# Patient Record
Sex: Male | Born: 1995 | Hispanic: No | Marital: Single | Smoking: Never smoker
Health system: Southern US, Community
[De-identification: ages and names within clinical notes are randomized; demographics above are authoritative.]

## PROBLEM LIST (undated history)

## (undated) DIAGNOSIS — IMO0001 Reserved for inherently not codable concepts without codable children: Secondary | ICD-10-CM

## (undated) DIAGNOSIS — K219 Gastro-esophageal reflux disease without esophagitis: Secondary | ICD-10-CM

## (undated) DIAGNOSIS — F988 Other specified behavioral and emotional disorders with onset usually occurring in childhood and adolescence: Secondary | ICD-10-CM

## (undated) DIAGNOSIS — Z9109 Other allergy status, other than to drugs and biological substances: Secondary | ICD-10-CM

## (undated) HISTORY — PX: WISDOM TOOTH EXTRACTION: SHX21

## (undated) HISTORY — PX: KNEE SURGERY: SHX244

---
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
## 2015-10-15 ENCOUNTER — Emergency Department (HOSPITAL_COMMUNITY)
Admission: EM | Admit: 2015-10-15 | Discharge: 2015-10-15 | Disposition: A | Discharge status: Home / Self Care | Payer: Medicaid Other | Attending: Emergency Medicine | Admitting: Emergency Medicine

## 2015-10-15 ENCOUNTER — Emergency Department (HOSPITAL_COMMUNITY): Payer: Medicaid Other

## 2015-10-15 ENCOUNTER — Encounter (HOSPITAL_COMMUNITY): Payer: Self-pay | Admitting: Emergency Medicine

## 2015-10-15 DIAGNOSIS — S8992XA Unspecified injury of left lower leg, initial encounter: Secondary | ICD-10-CM | POA: Insufficient documentation

## 2015-10-15 DIAGNOSIS — Z8719 Personal history of other diseases of the digestive system: Secondary | ICD-10-CM | POA: Diagnosis not present

## 2015-10-15 DIAGNOSIS — J45909 Unspecified asthma, uncomplicated: Secondary | ICD-10-CM | POA: Insufficient documentation

## 2015-10-15 DIAGNOSIS — Z79899 Other long term (current) drug therapy: Secondary | ICD-10-CM | POA: Diagnosis not present

## 2015-10-15 DIAGNOSIS — W503XXA Accidental bite by another person, initial encounter: Secondary | ICD-10-CM | POA: Diagnosis not present

## 2015-10-15 DIAGNOSIS — Z8659 Personal history of other mental and behavioral disorders: Secondary | ICD-10-CM | POA: Diagnosis not present

## 2015-10-15 DIAGNOSIS — Y9367 Activity, basketball: Secondary | ICD-10-CM | POA: Diagnosis not present

## 2015-10-15 DIAGNOSIS — Y998 Other external cause status: Secondary | ICD-10-CM | POA: Insufficient documentation

## 2015-10-15 DIAGNOSIS — W1839XA Other fall on same level, initial encounter: Secondary | ICD-10-CM | POA: Insufficient documentation

## 2015-10-15 DIAGNOSIS — Y92310 Basketball court as the place of occurrence of the external cause: Secondary | ICD-10-CM | POA: Insufficient documentation

## 2015-10-15 MED ORDER — HYDROCODONE-ACETAMINOPHEN 5-325 MG PO TABS: 1.0000 | ORAL_TABLET | ORAL | Status: DC | PRN

## 2015-10-15 MED ORDER — IBUPROFEN 800 MG PO TABS: 800.0000 mg | ORAL_TABLET | Freq: Three times a day (TID) | ORAL | Status: DC | PRN

## 2015-10-15 NOTE — Discharge Instructions (Signed)
Read the information below.  Use the prescribed medication as directed.  Please discuss all new medications with your pharmacist.  Do not take additional tylenol while taking the prescribed pain medication to avoid overdose.  You may return to the Emergency Department at any time for worsening condition or any new symptoms that concern you.    If you develop uncontrolled pain, weakness or numbness of the extremity, severe discoloration of the skin, or you are unable to walk or move your knee or lower leg, return to the ER for a recheck.

## 2015-10-15 NOTE — ED Notes (Signed)
Playing basketball yesterday, stepped wrong, felt a pop in his left knee followed with instant pain that is worsening into today. Pain worse with palpation, able to walk with cane.

## 2015-10-15 NOTE — ED Provider Notes (Signed)
CSN: [id number]     Arrival date & time 10/15/15  1431 History  By signing my name below, I, Octavia Heir, attest that this documentation has been prepared under the direction and in the presence of Cherylyn Sundby, PA-C. Electronically Signed: Octavia Heir, ED Scribe. 10/15/2015. 4:16 PM.    Chief Complaint  Patient presents with  . Knee Injury     The history is provided by the patient. No language interpreter was used.   HPI Comments: Clifford Moore is a 20 y.o. male who presents to the Emergency Department complaining of a constant, moderate, gradual worsening left knee injury onset yesterday. Pt states was playing basketball, he planted his foot and was hit in the chest by another play,  fell back and his foot twisted. He took some goody powder and  ibuprofen to alleviate the pain with no relief. He reports injuring the same knee four times and having surgery to repair his meniscus in the past, unsure of the orthopedist. Denies weakness, numbness, dizziness, and loss of consciousness.  Denies hitting head.  Denies hip pain or any other injury.  Past Medical History  Diagnosis Date  . Asthma   . Reflux     age 40 yrs  . Pollen allergies     grass  . ADD (attention deficit disorder)    Past Surgical History  Procedure Laterality Date  . Wisdom tooth extraction    . Knee surgery Left    History reviewed. No pertinent family history. Social History  Substance Use Topics  . Smoking status: Never Smoker   . Smokeless tobacco: None  . Alcohol Use: Yes     Comment: occasional    Review of Systems  Constitutional: Negative for activity change and appetite change.  Cardiovascular: Negative for chest pain.  Gastrointestinal: Negative for abdominal pain.  Musculoskeletal: Positive for arthralgias and gait problem. Negative for back pain and neck pain.  Skin: Negative for color change, pallor, rash and wound.  Allergic/Immunologic: Negative for immunocompromised state.   Neurological: Negative for dizziness, weakness and numbness.  Hematological: Does not bruise/bleed easily.  Psychiatric/Behavioral: Negative for self-injury.      Allergies  Peanuts  Home Medications   Prior to Admission medications   Medication Sig Start Date End Date Taking? Authorizing Provider  albuterol (PROVENTIL) (2.5 MG/3ML) 0.083% nebulizer solution Take 2.5 mg by nebulization every 6 (six) hours as needed.    Historical Provider, MD  cetirizine (ZYRTEC) 10 MG tablet Take 10 mg by mouth daily.    Historical Provider, MD  HYDROcodone-acetaminophen (NORCO/VICODIN) 5-325 MG per tablet Take 1-2 tablets by mouth every 6 (six) hours as needed (for pain). 12/11/14   John Molpus, MD  ibuprofen (ADVIL,MOTRIN) 800 MG tablet Take 1 tablet (800 mg total) by mouth every 8 (eight) hours as needed for mild pain or moderate pain. 08/06/14   Pricilla Loveless, MD   Triage vitals: BP 122/68 mmHg  Pulse 75  Temp(Src) 97.8 F (36.6 C) (Oral)  Resp 18  SpO2 99% Physical Exam  Constitutional: He appears well-developed and well-nourished. No distress.  HENT:  Head: Normocephalic and atraumatic.  Neck: Neck supple.  Cardiovascular: Normal rate.   Pulmonary/Chest: Effort normal.  Musculoskeletal: He exhibits no edema.  LEFT KNEE: Tenderness inferior to patella along the medial aspect of the left knee, distal pulses intact, sensation intact, anterior and poster drawer negative, pain with valgas stress, pain with axial loading.   No other tenderness throughout left leg.   Neurological: He is alert.  Skin: He is not diaphoretic.  Nursing note and vitals reviewed.   ED Course  Procedures  DIAGNOSTIC STUDIES: Oxygen Saturation is 99% on RA, normal by my interpretation.  COORDINATION OF CARE:  4:14 PM Discussed treatment plan which includes knee immobilizer and visit orthopaedics with pt at bedside and pt agreed to plan.  Labs Review Labs Reviewed - No data to display  Imaging Review Dg  Knee Complete 4 Views Left  10/15/2015  CLINICAL DATA:  Medial knee pain and swelling after injury playing basketball yesterday. EXAM: LEFT KNEE - COMPLETE 4+ VIEW COMPARISON:  12/11/2014. FINDINGS: The mineralization and alignment are normal. There is no evidence of acute fracture or dislocation. The joint spaces are maintained. There is a new at least moderate-sized knee joint effusion. IMPRESSION: No acute osseous findings are demonstrated. There is a new at least moderate size joint effusion which may indicate internal derangement or occult osseous injury. Electronically Signed   By: Carey Bullocks M.D.   On: 10/15/2015 15:41   I have personally reviewed and evaluated these images and lab results as part of my medical decision-making.   EKG Interpretation None      MDM   Final diagnoses:  Left knee injury, initial encounter    Afebrile, nontoxic patient with injury to his left knee while falling/playing basketball.   Xray demonstrates joint effusion.  Concern for meniscus/MCL injury.  Doubt fracture given mechanism.   D/C home with knee immobilizer, crutches, pain medication, orthopedic follow up.  Discussed result, findings, treatment, and follow up  with patient.  Pt given return precautions.  Pt verbalizes understanding and agrees with plan.      I personally performed the services described in this documentation, which was scribed in my presence. The recorded information has been reviewed and is accurate.   Trixie Dredge, PA-C 10/15/15 1650  Azalia Bilis, MD 10/15/15 [phone]

## 2016-08-13 ENCOUNTER — Ambulatory Visit (HOSPITAL_COMMUNITY)
Admission: EM | Admit: 2016-08-13 | Discharge: 2016-08-13 | Disposition: A | Discharge status: Home / Self Care | Payer: Medicaid Other | Attending: Emergency Medicine | Admitting: Emergency Medicine

## 2016-08-13 DIAGNOSIS — M25562 Pain in left knee: Secondary | ICD-10-CM

## 2016-08-13 MED ORDER — NAPROXEN 500 MG PO TABS: 500.0000 mg | ORAL_TABLET | Freq: Two times a day (BID) | ORAL | 0 refills | Status: DC

## 2016-08-13 NOTE — ED Provider Notes (Signed)
CSN: [id number]     Arrival date & time 08/13/16  1633 History   First MD Initiated Contact with Patient 08/13/16 1704     No chief complaint on file.  (Consider location/radiation/quality/duration/timing/severity/associated sxs/prior Treatment) Patient injured his left knee in high school playing football and required surgery.  He has been doing fine but developed some left medial knee pain a few days ago and it is worsening.  He has difficulty standing for prolonged period.  He has pain with flexion and extension of knee.  He denies any trauma.   The history is provided by the patient.  Knee Pain  Location:  Knee Injury: no   Knee location:  L knee Pain details:    Quality:  Aching   Radiates to:  Does not radiate   Severity:  Moderate   Onset quality:  Sudden   Duration:  2 days   Timing:  Constant   Progression:  Worsening Chronicity:  New Dislocation: no   Foreign body present:  No foreign bodies Tetanus status:  Unknown Prior injury to area:  Yes Relieved by:  None tried Worsened by:  Extension and flexion Ineffective treatments:  None tried   Past Medical History:  Diagnosis Date  . ADD (attention deficit disorder)   . Asthma   . Pollen allergies    grass  . Reflux    age 20 yrs   Past Surgical History:  Procedure Laterality Date  . KNEE SURGERY Left   . WISDOM TOOTH EXTRACTION     No family history on file. Social History  Substance Use Topics  . Smoking status: Never Smoker  . Smokeless tobacco: Not on file  . Alcohol use Yes     Comment: occasional    Review of Systems  Constitutional: Negative.   HENT: Negative.   Eyes: Negative.   Respiratory: Negative.   Cardiovascular: Negative.   Gastrointestinal: Negative.   Endocrine: Negative.   Genitourinary: Negative.   Musculoskeletal: Positive for arthralgias.  Skin: Negative.   Allergic/Immunologic: Negative.   Neurological: Negative.   Hematological: Negative.   Psychiatric/Behavioral:  Negative.     Allergies  Peanuts [peanut oil]  Home Medications   Prior to Admission medications   Medication Sig Start Date End Date Taking? Authorizing Provider  albuterol (PROVENTIL) (2.5 MG/3ML) 0.083% nebulizer solution Take 2.5 mg by nebulization every 6 (six) hours as needed.    Historical Provider, MD  cetirizine (ZYRTEC) 10 MG tablet Take 10 mg by mouth daily.    Historical Provider, MD  HYDROcodone-acetaminophen (NORCO/VICODIN) 5-325 MG tablet Take 1-2 tablets by mouth every 4 (four) hours as needed for moderate pain or severe pain. 10/15/15   Trixie DredgeEmily West, PA-C  ibuprofen (ADVIL,MOTRIN) 800 MG tablet Take 1 tablet (800 mg total) by mouth every 8 (eight) hours as needed for mild pain or moderate pain. 10/15/15   Trixie DredgeEmily West, PA-C  naproxen (NAPROSYN) 500 MG tablet Take 1 tablet (500 mg total) by mouth 2 (two) times daily with a meal. 08/13/16   Deatra CanterWilliam J Oxford, FNP   Meds Ordered and Administered this Visit  Medications - No data to display  BP 109/64 (BP Location: Left Arm)   Pulse 72   Temp 98.7 F (37.1 C) (Oral)   Resp 16   SpO2 99%  No data found.   Physical Exam  Constitutional: He appears well-developed and well-nourished.  HENT:  Head: Normocephalic and atraumatic.  Eyes: EOM are normal. Pupils are equal, round, and reactive to light.  Neck: Normal range of motion. Neck supple.  Cardiovascular: Normal rate and regular rhythm.   Pulmonary/Chest: Effort normal and breath sounds normal.  Musculoskeletal:  TTP medial let knee.  Neg drawer and neg varus/ valgus strain.  Neg Mcmurray  Skin: Skin is warm and dry.  Nursing note and vitals reviewed.   Urgent Care Course   Clinical Course     Procedures (including critical care time)  Labs Review Labs Reviewed - No data to display  Imaging Review No results found.   Visual Acuity Review  Right Eye Distance:   Left Eye Distance:   Bilateral Distance:    Right Eye Near:   Left Eye Near:    Bilateral  Near:         MDM   1. Acute pain of left knee    Naprosyn 500mg  one po bid x 10 days #20 If not better then follow up with ortho     Deatra CanterWilliam J Oxford, FNP 08/13/16 1720    Deatra CanterWilliam J Oxford, FNP 08/13/16 1723

## 2017-03-07 ENCOUNTER — Encounter (HOSPITAL_COMMUNITY): Payer: Self-pay | Admitting: Emergency Medicine

## 2017-03-07 ENCOUNTER — Emergency Department (HOSPITAL_COMMUNITY): Payer: Medicaid Other

## 2017-03-07 ENCOUNTER — Emergency Department (HOSPITAL_COMMUNITY)
Admission: EM | Admit: 2017-03-07 | Discharge: 2017-03-07 | Disposition: A | Discharge status: Home / Self Care | Payer: Medicaid Other | Attending: Emergency Medicine | Admitting: Emergency Medicine

## 2017-03-07 DIAGNOSIS — S20312A Abrasion of left front wall of thorax, initial encounter: Secondary | ICD-10-CM | POA: Insufficient documentation

## 2017-03-07 DIAGNOSIS — S0083XA Contusion of other part of head, initial encounter: Secondary | ICD-10-CM | POA: Insufficient documentation

## 2017-03-07 DIAGNOSIS — Y92511 Restaurant or cafe as the place of occurrence of the external cause: Secondary | ICD-10-CM | POA: Insufficient documentation

## 2017-03-07 DIAGNOSIS — Y9389 Activity, other specified: Secondary | ICD-10-CM | POA: Insufficient documentation

## 2017-03-07 DIAGNOSIS — Y999 Unspecified external cause status: Secondary | ICD-10-CM | POA: Insufficient documentation

## 2017-03-07 DIAGNOSIS — Z79899 Other long term (current) drug therapy: Secondary | ICD-10-CM | POA: Insufficient documentation

## 2017-03-07 DIAGNOSIS — R935 Abnormal findings on diagnostic imaging of other abdominal regions, including retroperitoneum: Secondary | ICD-10-CM | POA: Insufficient documentation

## 2017-03-07 DIAGNOSIS — Z91010 Allergy to peanuts: Secondary | ICD-10-CM | POA: Insufficient documentation

## 2017-03-07 DIAGNOSIS — F10120 Alcohol abuse with intoxication, uncomplicated: Secondary | ICD-10-CM | POA: Insufficient documentation

## 2017-03-07 DIAGNOSIS — R931 Abnormal findings on diagnostic imaging of heart and coronary circulation: Secondary | ICD-10-CM | POA: Insufficient documentation

## 2017-03-07 DIAGNOSIS — F909 Attention-deficit hyperactivity disorder, unspecified type: Secondary | ICD-10-CM | POA: Insufficient documentation

## 2017-03-07 DIAGNOSIS — F10920 Alcohol use, unspecified with intoxication, uncomplicated: Secondary | ICD-10-CM

## 2017-03-07 DIAGNOSIS — S40212A Abrasion of left shoulder, initial encounter: Secondary | ICD-10-CM | POA: Insufficient documentation

## 2017-03-07 DIAGNOSIS — J45909 Unspecified asthma, uncomplicated: Secondary | ICD-10-CM | POA: Insufficient documentation

## 2017-03-07 LAB — I-STAT CHEM 8, ED
BUN: 14 mg/dL (ref 6–20)
CHLORIDE: 106 mmol/L (ref 101–111)
CREATININE: 1 mg/dL (ref 0.61–1.24)
Calcium, Ion: 1.05 mmol/L — ABNORMAL LOW (ref 1.15–1.40)
Glucose, Bld: 113 mg/dL — ABNORMAL HIGH (ref 65–99)
HEMATOCRIT: 45 % (ref 39.0–52.0)
Hemoglobin: 15.3 g/dL (ref 13.0–17.0)
POTASSIUM: 4.9 mmol/L (ref 3.5–5.1)
SODIUM: 141 mmol/L (ref 135–145)
TCO2: 25 mmol/L (ref 0–100)

## 2017-03-07 LAB — BASIC METABOLIC PANEL
ANION GAP: 12 (ref 5–15)
BUN: 11 mg/dL (ref 6–20)
CHLORIDE: 106 mmol/L (ref 101–111)
CO2: 22 mmol/L (ref 22–32)
Calcium: 9.2 mg/dL (ref 8.9–10.3)
Creatinine, Ser: 0.94 mg/dL (ref 0.61–1.24)
GFR calc Af Amer: >60 mL/min (ref >60)
GFR calc non Af Amer: >60 mL/min (ref >60)
GLUCOSE: 108 mg/dL — AB (ref 65–99)
POTASSIUM: 3.8 mmol/L (ref 3.5–5.1)
Sodium: 140 mmol/L (ref 135–145)

## 2017-03-07 LAB — CBC WITH DIFFERENTIAL/PLATELET
BASOS ABS: 0 10*3/uL (ref 0.0–0.1)
Basophils Relative: 0 %
Eosinophils Absolute: 0.1 10*3/uL (ref 0.0–0.7)
Eosinophils Relative: 1 %
HCT: 43.8 % (ref 39.0–52.0)
Hemoglobin: 15.7 g/dL (ref 13.0–17.0)
Lymphocytes Relative: 25 %
Lymphs Abs: 2.2 10*3/uL (ref 0.7–4.0)
MCH: 30.7 pg (ref 26.0–34.0)
MCHC: 35.8 g/dL (ref 30.0–36.0)
MCV: 85.5 fL (ref 78.0–100.0)
MONO ABS: 0.4 10*3/uL (ref 0.1–1.0)
Monocytes Relative: 5 %
Neutro Abs: 5.9 10*3/uL (ref 1.7–7.7)
Neutrophils Relative %: 69 %
Platelets: 230 10*3/uL (ref 150–400)
RBC: 5.12 MIL/uL (ref 4.22–5.81)
RDW: 13.1 % (ref 11.5–15.5)
WBC: 8.6 10*3/uL (ref 4.0–10.5)

## 2017-03-07 LAB — ETHANOL: Alcohol, Ethyl (B): 177 mg/dL — ABNORMAL HIGH (ref <5)

## 2017-03-07 MED ORDER — IOPAMIDOL (ISOVUE-300) INJECTION 61%
INTRAVENOUS | Status: AC
  Administered 2017-03-07: 100 mL
  Filled 2017-03-07: qty 100

## 2017-03-07 MED ORDER — ONDANSETRON 4 MG PO TBDP: 4.0000 mg | ORAL_TABLET | Freq: Three times a day (TID) | ORAL | 0 refills | Status: DC | PRN

## 2017-03-07 MED ORDER — TETANUS-DIPHTH-ACELL PERTUSSIS 5-2.5-18.5 LF-MCG/0.5 IM SUSP
0.5000 mL | Freq: Once | INTRAMUSCULAR | Status: AC
  Administered 2017-03-07: 0.5 mL via INTRAMUSCULAR
  Filled 2017-03-07: qty 0.5

## 2017-03-07 MED ORDER — ONDANSETRON HCL 4 MG/2ML IJ SOLN
4.0000 mg | Freq: Once | INTRAMUSCULAR | Status: AC
  Administered 2017-03-07: 4 mg via INTRAVENOUS
  Filled 2017-03-07: qty 2

## 2017-03-07 MED ORDER — CEPHALEXIN 500 MG PO CAPS: 500.0000 mg | ORAL_CAPSULE | Freq: Four times a day (QID) | ORAL | 0 refills | Status: DC

## 2017-03-07 MED ORDER — IBUPROFEN 400 MG PO TABS
600.0000 mg | ORAL_TABLET | Freq: Once | ORAL | Status: AC
  Administered 2017-03-07: 600 mg via ORAL
  Filled 2017-03-07: qty 1
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
## 2017-03-07 NOTE — ED Notes (Signed)
Pt transported to CT with this RN 

## 2017-03-07 NOTE — ED Notes (Signed)
Patient to CT with RN and EMT. 
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
## 2017-03-07 NOTE — ED Notes (Signed)
C-collar in place

## 2017-03-07 NOTE — ED Notes (Signed)
Pt had period of apnea, SpO2 dropped to 72%. Dr. Manus Gunningancour aware

## 2017-03-07 NOTE — ED Notes (Signed)
ED Provider instructed this RN to take C-collar off patient for ambulation and PO challenge.

## 2017-11-29 IMAGING — CR DG KNEE COMPLETE 4+V*L*
4 series · 4 of 4 positions shown · non-contrast
Comparison: 12/11/2014.

CLINICAL DATA: Medial knee pain and swelling after injury playing
basketball yesterday.

EXAM:
LEFT KNEE - COMPLETE 4+ VIEW

[t knee ap left]
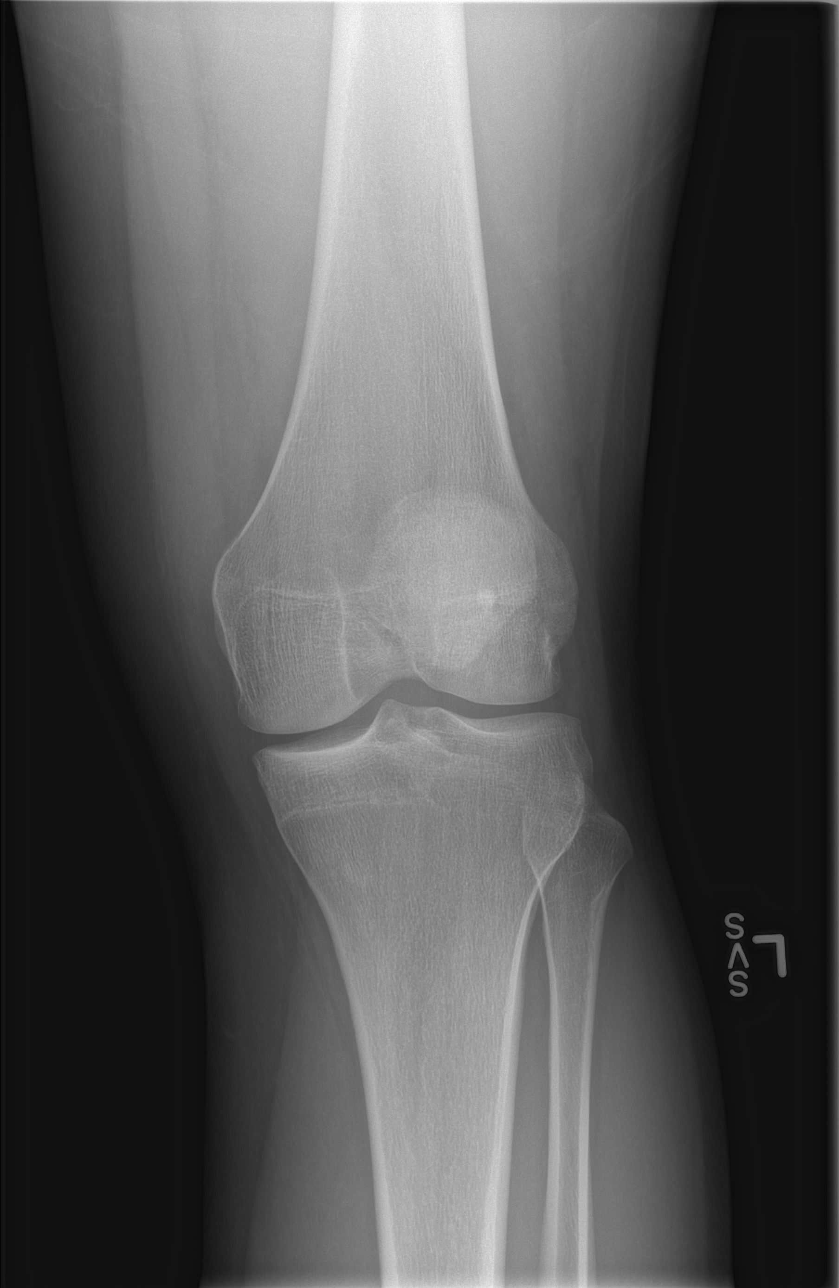

[t knee obl left (1 of 2)]
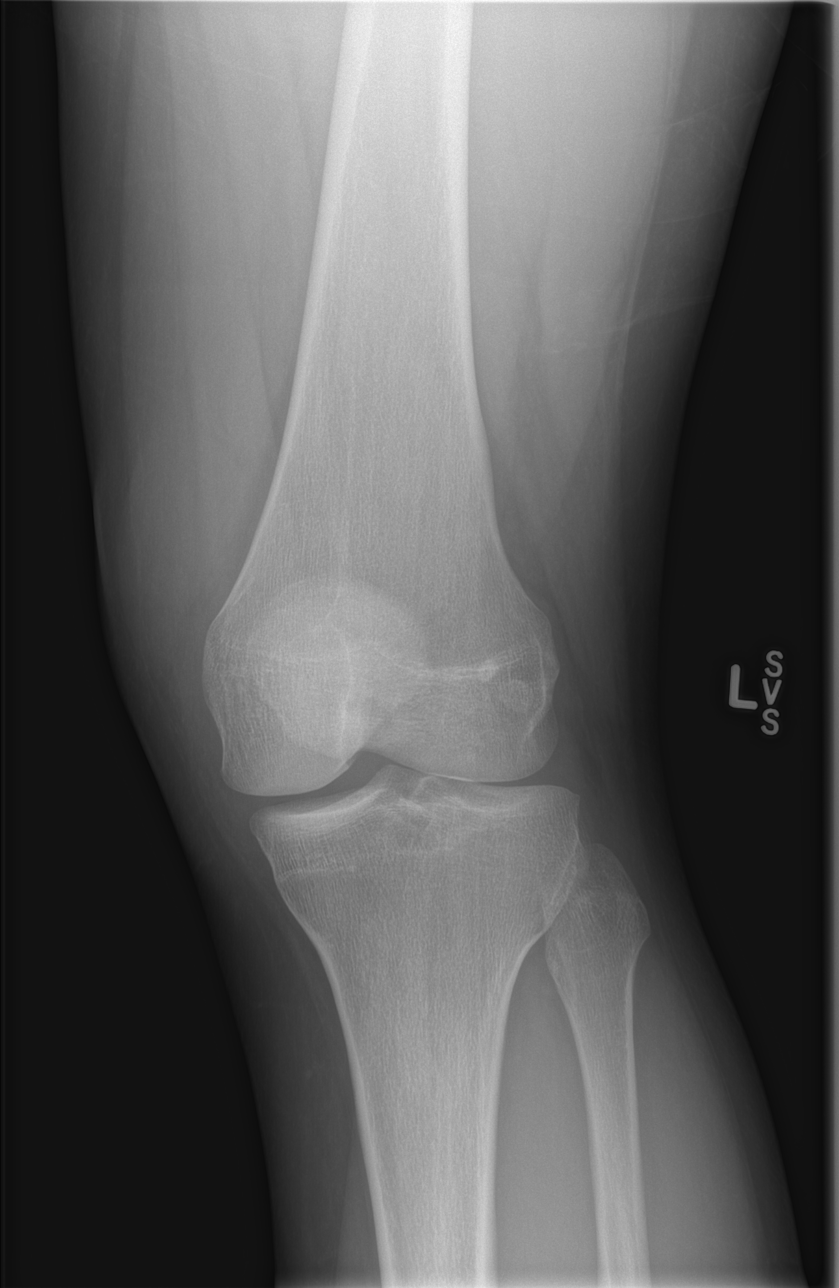

[t knee obl left (2 of 2)]
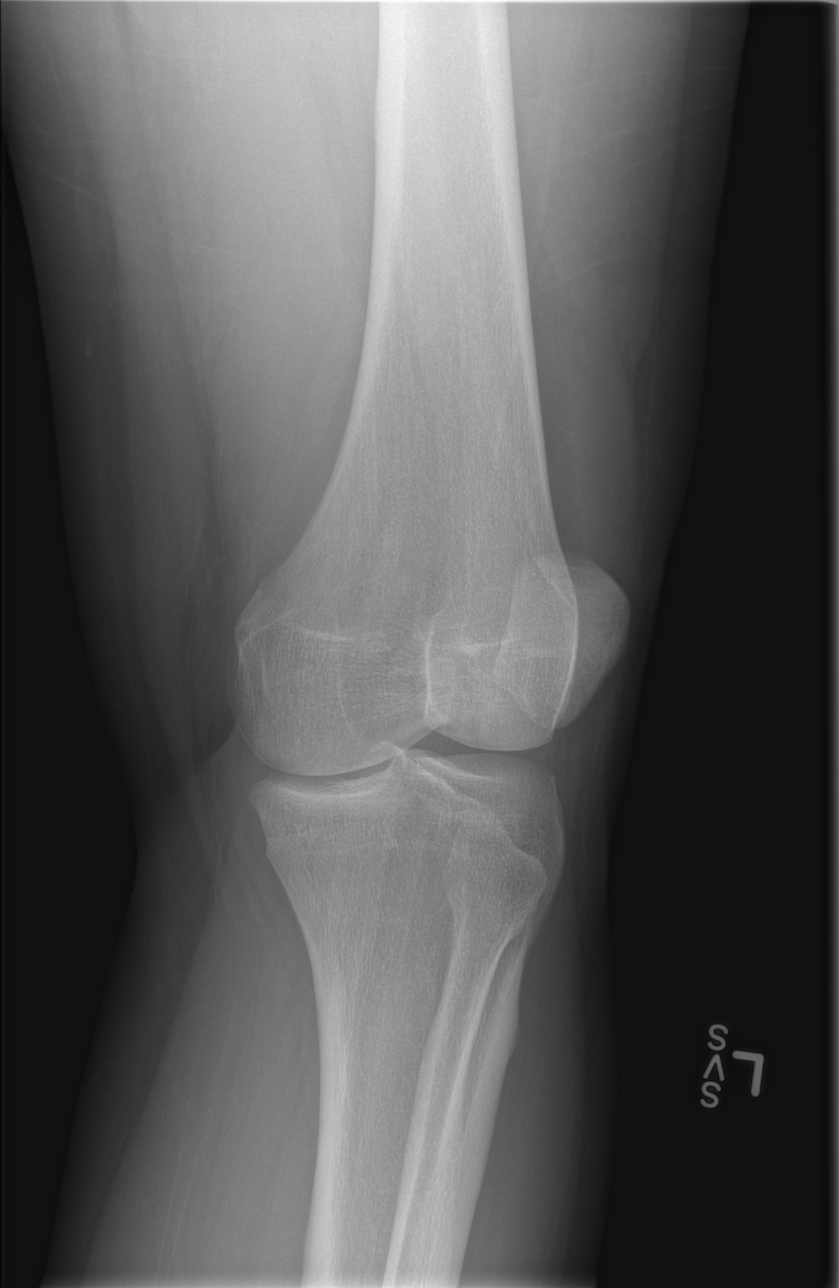

[t knee lat left]
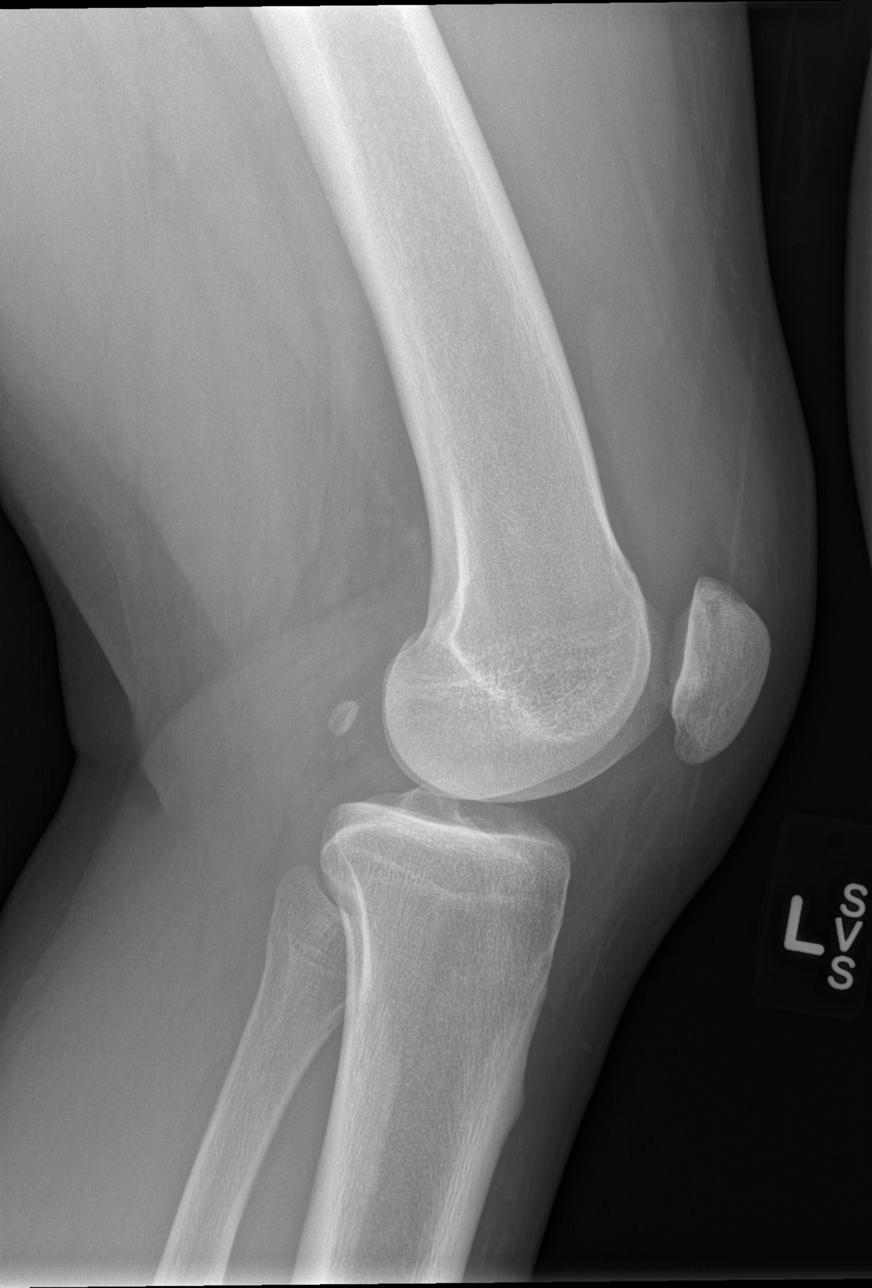

[4 of 4 positions shown; findings below may reference images not displayed]

FINDINGS: The mineralization and alignment are normal. There is no evidence of
acute fracture or dislocation. The joint spaces are maintained.
There is a new at least moderate-sized knee joint effusion.
IMPRESSION: No acute osseous findings are demonstrated. There is a new at least
moderate size joint effusion which may indicate internal derangement
or occult osseous injury.

## 2019-03-13 ENCOUNTER — Encounter (HOSPITAL_COMMUNITY): Payer: Self-pay

## 2019-03-13 ENCOUNTER — Ambulatory Visit (HOSPITAL_COMMUNITY)
Admission: EM | Admit: 2019-03-13 | Discharge: 2019-03-13 | Disposition: A | Discharge status: Home / Self Care | Payer: Self-pay | Attending: Urgent Care | Admitting: Urgent Care

## 2019-03-13 ENCOUNTER — Other Ambulatory Visit: Payer: Self-pay

## 2019-03-13 DIAGNOSIS — K529 Noninfective gastroenteritis and colitis, unspecified: Secondary | ICD-10-CM

## 2019-03-13 DIAGNOSIS — R112 Nausea with vomiting, unspecified: Secondary | ICD-10-CM

## 2019-03-13 MED ORDER — ONDANSETRON HCL 4 MG/2ML IJ SOLN: 4.0000 mg | Freq: Once | INTRAMUSCULAR | Status: DC

## 2019-03-13 MED ORDER — ONDANSETRON 8 MG PO TBDP: 8.0000 mg | ORAL_TABLET | Freq: Three times a day (TID) | ORAL | 0 refills | Status: AC | PRN

## 2019-03-13 MED ORDER — ONDANSETRON 4 MG PO TBDP
8.0000 mg | ORAL_TABLET | Freq: Once | ORAL | Status: AC
  Administered 2019-03-13: 8 mg via ORAL

## 2019-03-13 MED ORDER — ONDANSETRON 4 MG PO TBDP
ORAL_TABLET | ORAL | Status: AC
  Filled 2019-03-13: qty 1

## 2019-03-13 NOTE — ED Triage Notes (Signed)
Patient presents to Urgent Care with complaints of abdominal pain and vomiting since 2 days ago. Patient reports he has been able to keep fluids down today, but is nauseous. Pt had to leave work early last night, needs note to return.

## 2019-03-13 NOTE — ED Provider Notes (Signed)
MRN: [id number] DOB: 02/28/1996  Subjective:   Clifford Moore is a 23 y.o. male presenting for 2-day history of nausea with vomiting, 3 episodes total.  Has not tried any medications for symptoms.  Reports that he had a smoothie and a meat stick, symptoms started thereafter.  He is not taking any medications currently.  He has not taken any antibiotics recently.  Diet is generally the same.    Allergies  Allergen Reactions  . Peanuts [Peanut Oil] Itching    Bananas, rash and throat swelling     Past Medical History:  Diagnosis Date  . ADD (attention deficit disorder)   . Asthma   . Pollen allergies    grass  . Reflux    age 53 yrs     Past Surgical History:  Procedure Laterality Date  . KNEE SURGERY Left   . WISDOM TOOTH EXTRACTION      Review of Systems  Constitutional: Negative for fever and malaise/fatigue.  HENT: Negative for congestion, ear pain, sinus pain and sore throat.   Eyes: Negative for blurred vision, double vision, discharge and redness.  Respiratory: Negative for cough, hemoptysis, shortness of breath and wheezing.   Cardiovascular: Negative for chest pain.  Gastrointestinal: Positive for nausea and vomiting. Negative for abdominal pain and diarrhea.  Genitourinary: Negative for dysuria, flank pain and hematuria.  Musculoskeletal: Negative for myalgias.  Skin: Negative for rash.  Neurological: Negative for dizziness, weakness and headaches.  Psychiatric/Behavioral: Negative for depression and substance abuse.    Objective:   Vitals: BP 130/90 (BP Location: Right Arm)   Pulse 97   Temp 98 F (36.7 C)   Resp 16   SpO2 99%   Physical Exam Constitutional:      General: He is not in acute distress.    Appearance: Normal appearance. He is well-developed. He is obese. He is not ill-appearing, toxic-appearing or diaphoretic.  HENT:     Head: Normocephalic and atraumatic.     Right Ear: External ear normal.     Left Ear: External ear normal.     Nose:  Nose normal.     Mouth/Throat:     Pharynx: Oropharynx is clear.  Eyes:     General: No scleral icterus.    Extraocular Movements: Extraocular movements intact.     Pupils: Pupils are equal, round, and reactive to light.  Cardiovascular:     Rate and Rhythm: Normal rate and regular rhythm.     Heart sounds: No murmur. No friction rub. No gallop.   Pulmonary:     Effort: Pulmonary effort is normal. No respiratory distress.     Breath sounds: No wheezing or rales.  Abdominal:     General: Bowel sounds are normal. There is no distension.     Palpations: Abdomen is soft. There is no mass.     Tenderness: There is no abdominal tenderness. There is no guarding or rebound.  Skin:    General: Skin is warm and dry.  Neurological:     Mental Status: He is alert and oriented to person, place, and time.  Psychiatric:        Mood and Affect: Mood normal.        Behavior: Behavior normal.     Assessment and Plan :   1. Nausea and vomiting, intractability of vomiting not specified, unspecified vomiting type   2. Gastroenteritis    Will manage for viral gastroenteritis with supportive care.  Rx for Zofran for nausea and vomiting.  Patient is  to push fluids and eat light meals including soups and soft foods. Counseled patient on potential for adverse effects with medications prescribed/recommended today, ER and return-to-clinic precautions discussed, patient verbalized understanding.     Wallis BambergMani, Mylo Driskill, [place] JerseyPA-C 03/13/19 [phone]
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
## 2021-08-03 ENCOUNTER — Encounter (HOSPITAL_BASED_OUTPATIENT_CLINIC_OR_DEPARTMENT_OTHER): Payer: Self-pay | Admitting: *Deleted

## 2021-08-03 ENCOUNTER — Emergency Department (HOSPITAL_BASED_OUTPATIENT_CLINIC_OR_DEPARTMENT_OTHER)
Admission: EM | Admit: 2021-08-03 | Discharge: 2021-08-03 | Disposition: A | Discharge status: Home / Self Care | Payer: Self-pay | Attending: Emergency Medicine | Admitting: Emergency Medicine

## 2021-08-03 ENCOUNTER — Other Ambulatory Visit: Payer: Self-pay

## 2021-08-03 DIAGNOSIS — Z20822 Contact with and (suspected) exposure to covid-19: Secondary | ICD-10-CM | POA: Insufficient documentation

## 2021-08-03 DIAGNOSIS — Z91010 Allergy to peanuts: Secondary | ICD-10-CM | POA: Insufficient documentation

## 2021-08-03 DIAGNOSIS — J45909 Unspecified asthma, uncomplicated: Secondary | ICD-10-CM | POA: Insufficient documentation

## 2021-08-03 DIAGNOSIS — J029 Acute pharyngitis, unspecified: Secondary | ICD-10-CM | POA: Insufficient documentation

## 2021-08-03 LAB — RESP PANEL BY RT-PCR (FLU A&B, COVID) ARPGX2
Influenza A by PCR: NEGATIVE
Influenza B by PCR: NEGATIVE
SARS Coronavirus 2 by RT PCR: NEGATIVE

## 2021-08-03 LAB — GROUP A STREP BY PCR: Group A Strep by PCR: NOT DETECTED

## 2021-08-03 MED ORDER — PREDNISONE 50 MG PO TABS
60.0000 mg | ORAL_TABLET | Freq: Once | ORAL | Status: AC
  Administered 2021-08-03: 60 mg via ORAL
  Filled 2021-08-03: qty 1

## 2021-08-03 NOTE — ED Provider Notes (Signed)
[facility] EMERGENCY DEPARTMENT Provider Note   CSN: [id number] Arrival date & time: 08/03/21  1355     History Chief Complaint  Patient presents with   Sore Throat    Clifford Moore is a 25 y.o. male.  78-year-old male with a past medical history of asthma, ADD presents to the ED with a chief complaint of sore throat that is been ongoing for the past 3 days.  He describes the pain as sharp, worsening with swallowing.  Does report similar symptoms in the past where he tested positive for strep pharyngitis.  He does report being around "a lot of people at the office ", some of them have been sick with upper respiratory infection the last couple weeks.  He has tried Chloraseptic spray, cough drops, tea without any improvement in his symptoms.  He denies any fever, no use in his voice, no prior hx of PTA.   The history is provided by the patient and medical records.  Sore Throat This is a new problem. Pertinent negatives include no chest pain and no shortness of breath.      Past Medical History:  Diagnosis Date   ADD (attention deficit disorder)    Asthma    Pollen allergies    grass   Reflux    age 30 yrs    There are no problems to display for this patient.   Past Surgical History:  Procedure Laterality Date   KNEE SURGERY Left    WISDOM TOOTH EXTRACTION         Family History  Problem Relation Age of Onset   Cancer Mother    Healthy Father     Social History   Tobacco Use   Smoking status: Never   Smokeless tobacco: Never  Vaping Use   Vaping Use: Never used  Substance Use Topics   Alcohol use: Yes    Comment: occasional   Drug use: Yes    Types: Marijuana    Comment: occasional    Home Medications Prior to Admission medications   Medication Sig Start Date End Date Taking? Authorizing Provider  ondansetron (ZOFRAN-ODT) 8 MG disintegrating tablet Take 1 tablet (8 mg total) by mouth every 8 (eight) hours as needed for nausea or vomiting.  03/13/19   Wallis Bamberg, PA-C    Allergies    Peanuts [peanut oil]  Review of Systems   Review of Systems  Constitutional:  Negative for fever.  HENT:  Positive for sore throat.   Respiratory:  Negative for cough and shortness of breath.   Cardiovascular:  Negative for chest pain.   Physical Exam Updated Vital Signs BP 132/84 (BP Location: Right Arm)   Pulse (!) 103   Temp 97.8 F (36.6 C) (Oral)   Resp 18   Ht 6' (1.829 m)   Wt (!) 149.7 kg   SpO2 100%   BMI 44.76 kg/m   Physical Exam Vitals and nursing note reviewed.  Constitutional:      Appearance: He is well-developed.  HENT:     Head: Normocephalic and atraumatic.     Mouth/Throat:     Mouth: Mucous membranes are moist.     Pharynx: Uvula midline. Posterior oropharyngeal erythema present. No pharyngeal swelling, oropharyngeal exudate or uvula swelling.     Tonsils: No tonsillar exudate or tonsillar abscesses. 2+ on the right. 2+ on the left.  Cardiovascular:     Rate and Rhythm: Normal rate.  Pulmonary:     Effort: Pulmonary effort is normal.  Abdominal:     Palpations: Abdomen is soft.  Musculoskeletal:     Cervical back: Normal range of motion and neck supple.  Skin:    General: Skin is warm and dry.  Neurological:     Mental Status: He is alert and oriented to person, place, and time.    ED Results / Procedures / Treatments   Labs (all labs ordered are listed, but only abnormal results are displayed) Labs Reviewed  RESP PANEL BY RT-PCR (FLU A&B, COVID) ARPGX2  GROUP A STREP BY PCR    EKG None  Radiology No results found.  Procedures Procedures   Medications Ordered in ED Medications  predniSONE (DELTASONE) tablet 60 mg (has no administration in time range)    ED Course  I have reviewed the triage vital signs and the nursing notes.  Pertinent labs & imaging results that were available during my care of the patient were reviewed by me and considered in my medical decision making (see  chart for details).    MDM Rules/Calculators/A&P    Patient presents to the ED with a chief complaint of sore throat for the past 3 days, unrelieved with over-the-counter medication.  Nursing contacted his office.  During evaluation oropharynx is clear, erythema is present, bilateral tonsillar 2+, but without any tonsillar exudate or PTA noted.  No dental caries, dental abscesses noted. Is midline without any swelling.  He was negative for strep pharyngitis today, COVID-19, influenza A.  We discussed the results of his testing, he was given a small dose of steroid in order to help with swelling and discomfort as this has been an ongoing complaint.  Suspect likely viral etiology.  We will have him follow-up with PCP, continue symptomatic treatment.  Patient is agreeable at this time, return precautions discussed at length   Portions of this note were generated with Dragon dictation software. Dictation errors may occur despite best attempts at proofreading.  Final Clinical Impression(s) / ED Diagnoses Final diagnoses:  Viral pharyngitis    Rx / DC Orders ED Discharge Orders     None        Claude Manges, PA-C 08/03/21 1549    Ernie Avena, MD 08/03/21 1715

## 2021-08-03 NOTE — Discharge Instructions (Addendum)
You tested negative for influenza A, influenza B, COVID-19.  You also tested negative for strep pharyngitis.  We discussed continued symptomatic treatment with over-the-counter medication such as hot tea, cough drops or Chloraseptic spray.   If you experience changes in your voice, shortness of breath, worsening pain you can return to the emergency department.

## 2021-08-03 NOTE — ED Triage Notes (Signed)
Pt reports "chills and sweats" since Wednesday night. Denies known fever. C/o throat painful to swallow. Concerned for strep
